# Patient Record
Sex: Female | Born: 1965 | Race: White | Hispanic: No | Marital: Married | Smoking: Never smoker
Health system: Southern US, Community
[De-identification: ages and names within clinical notes are randomized; demographics above are authoritative.]

## PROBLEM LIST (undated history)

## (undated) DIAGNOSIS — J309 Allergic rhinitis, unspecified: Secondary | ICD-10-CM

## (undated) DIAGNOSIS — G4730 Sleep apnea, unspecified: Secondary | ICD-10-CM

## (undated) HISTORY — DX: Sleep apnea, unspecified: G47.30

## (undated) HISTORY — DX: Allergic rhinitis, unspecified: J30.9

## (undated) HISTORY — PX: NO PAST SURGERIES: SHX2092

---

## 2008-03-12 ENCOUNTER — Ambulatory Visit: Payer: Self-pay | Admitting: Diagnostic Radiology

## 2008-03-12 ENCOUNTER — Emergency Department (HOSPITAL_BASED_OUTPATIENT_CLINIC_OR_DEPARTMENT_OTHER): Admission: EM | Admit: 2008-03-12 | Discharge: 2008-03-13 | Payer: Self-pay | Admitting: *Deleted

## 2009-08-20 IMAGING — CR DG KNEE COMPLETE 4+V*R*
5 series · 5 of 5 positions shown · non-contrast
Comparison: None

CLINICAL DATA: Fall.  Knee pain.

RIGHT KNEE - COMPLETE 4+ VIEW

[t knee ap right]
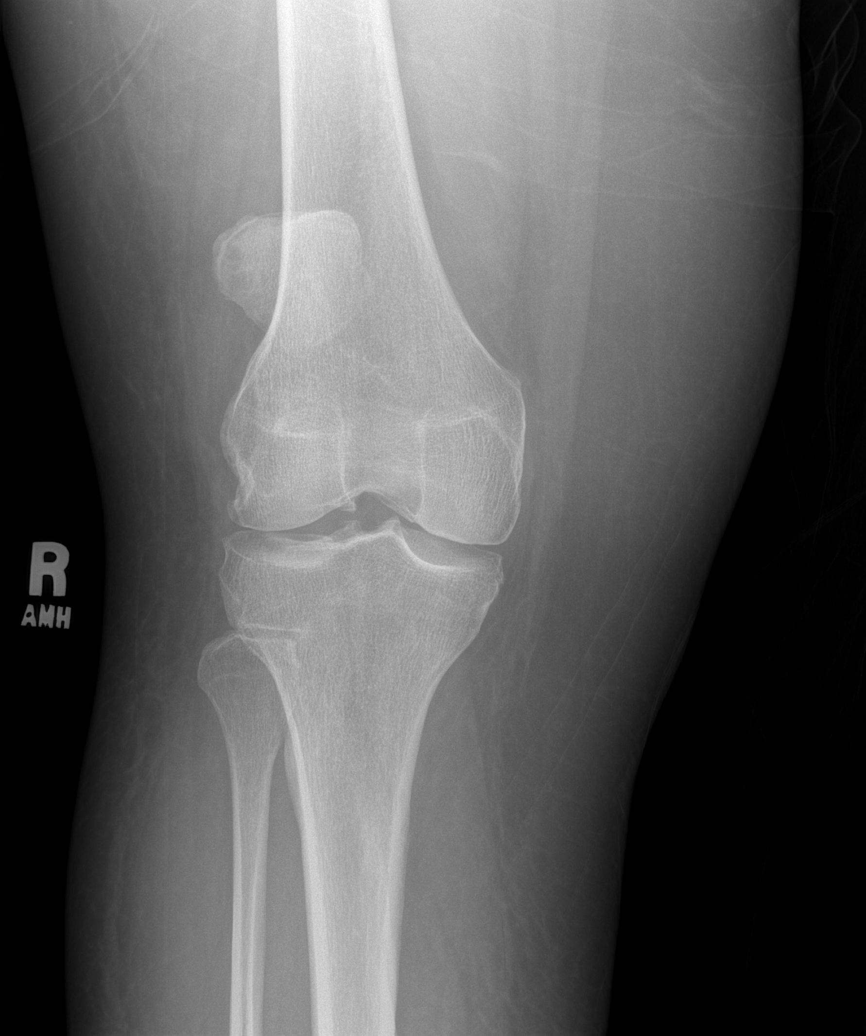

[t knee oblique right (1 of 3)]
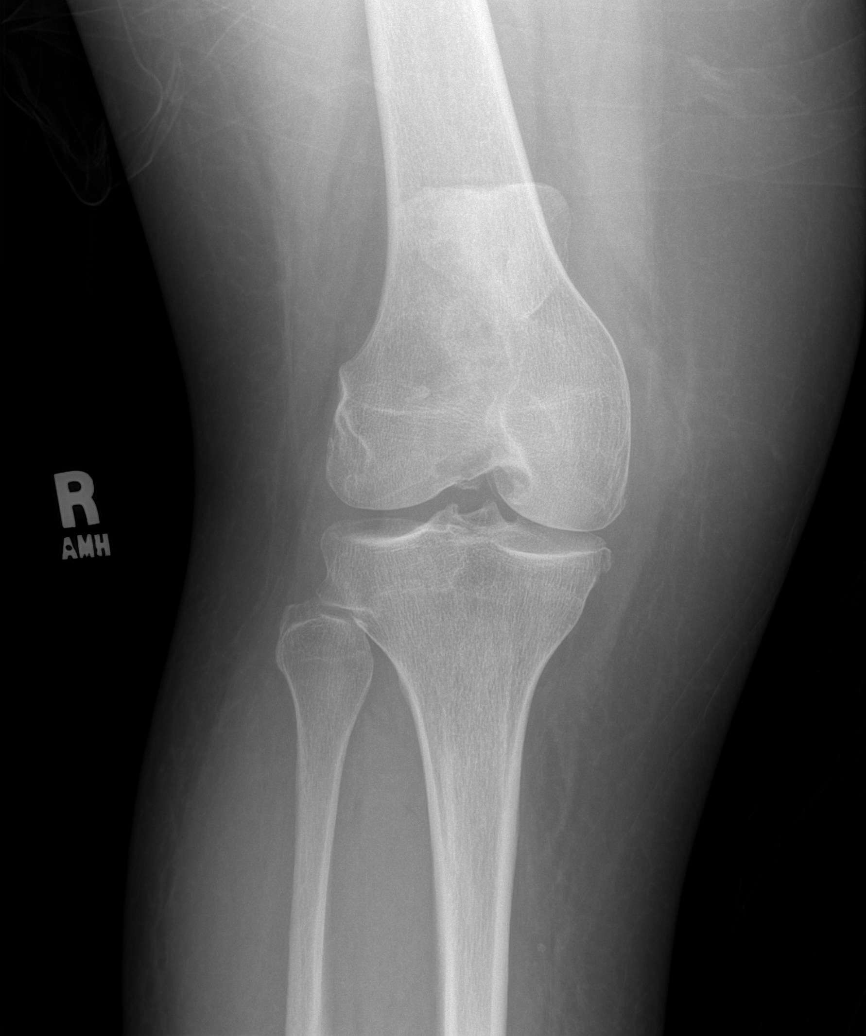

[t knee oblique right (2 of 3)]
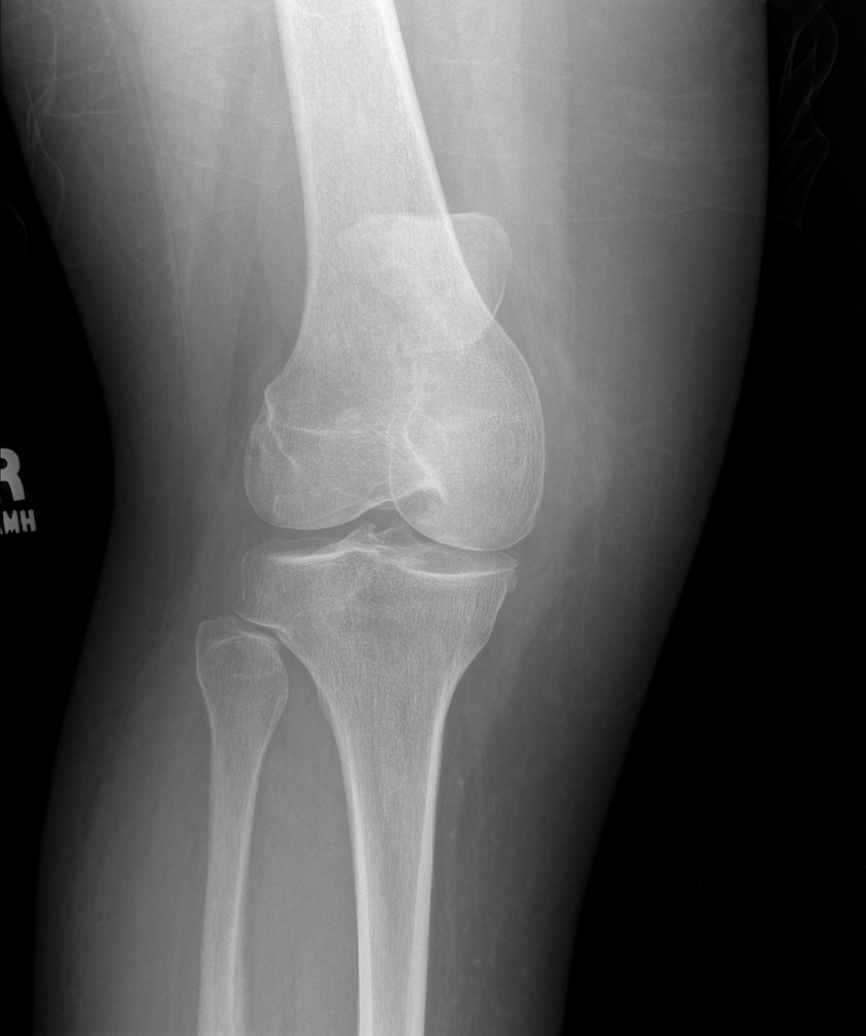

[t knee oblique right (3 of 3)]
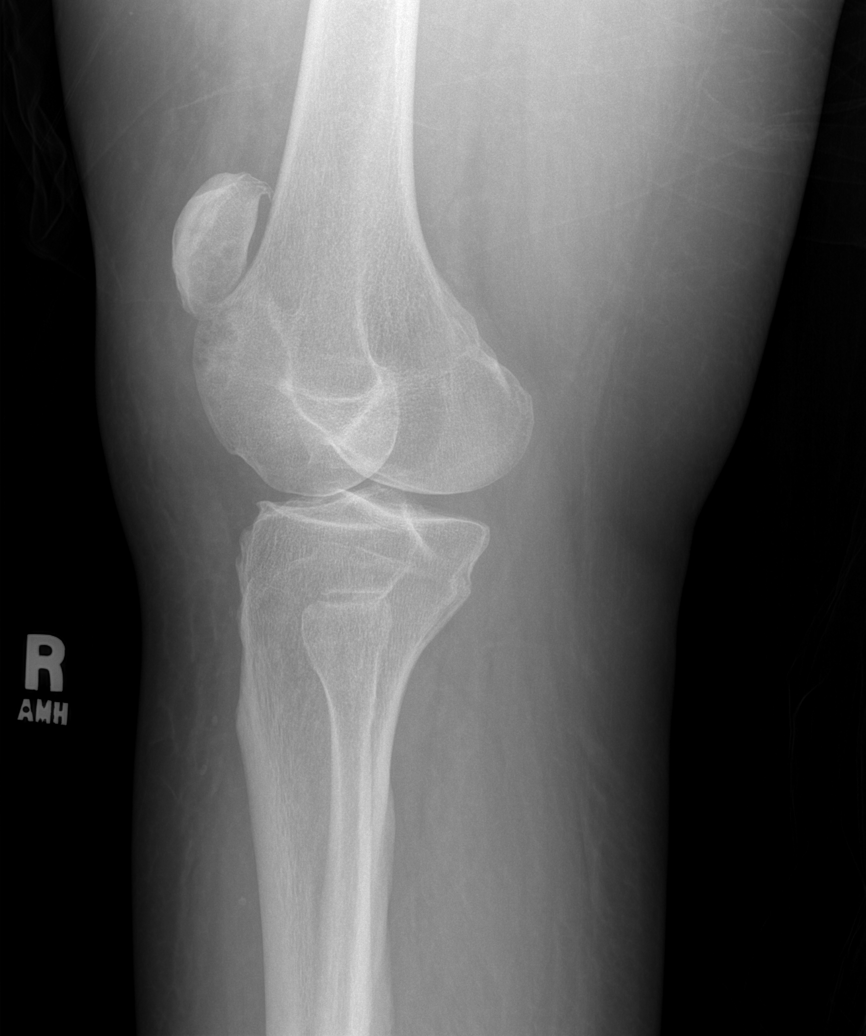

[t knee lat right]
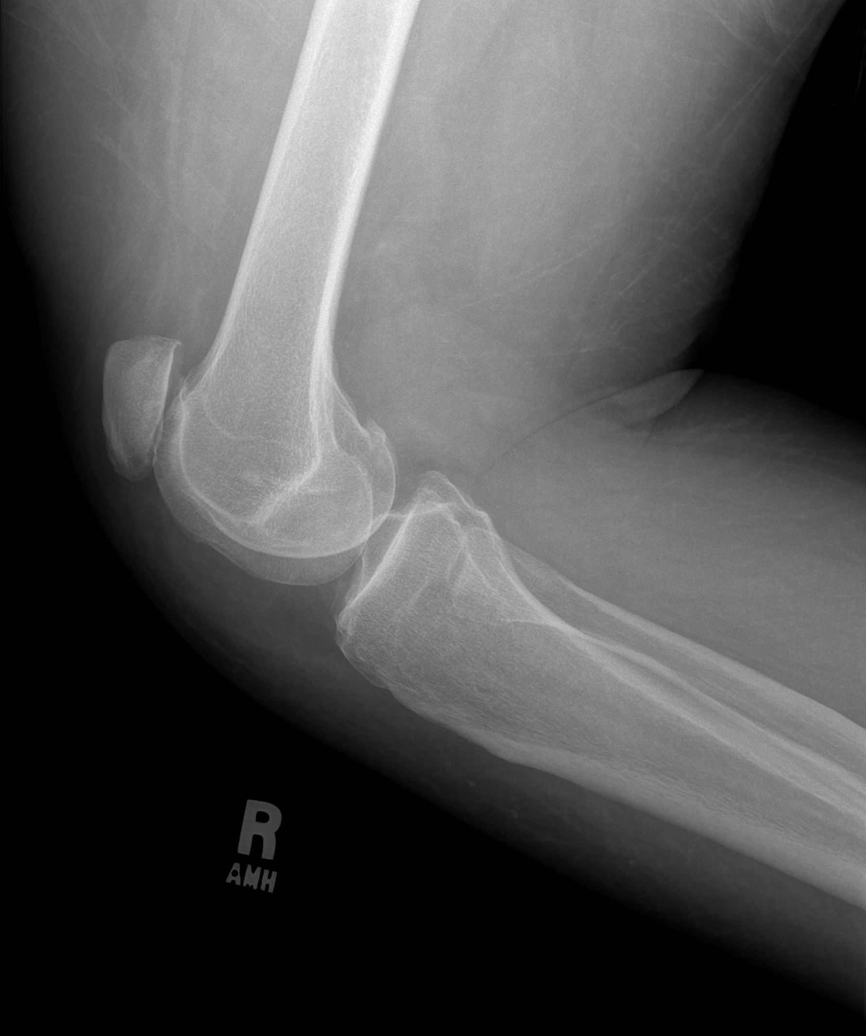

[5 of 5 positions shown; findings below may reference images not displayed]

FINDINGS: There is no joint effusion.

No fracture or dislocation identified.

Tricompartmental osteoarthritis with spur formation and sharpening
of the tibial spines noted.
IMPRESSION: 1.  No acute findings.
2.  Osteoarthritis

## 2015-02-23 ENCOUNTER — Encounter: Payer: Self-pay | Admitting: Emergency Medicine

## 2015-02-23 ENCOUNTER — Ambulatory Visit (INDEPENDENT_AMBULATORY_CARE_PROVIDER_SITE_OTHER): Payer: BLUE CROSS/BLUE SHIELD | Admitting: Emergency Medicine

## 2015-02-23 VITALS — BP 118/70 | HR 73 | Ht 62.0 in | Wt 340.0 lb

## 2015-02-23 DIAGNOSIS — G4733 Obstructive sleep apnea (adult) (pediatric): Secondary | ICD-10-CM

## 2015-02-23 NOTE — Assessment & Plan Note (Signed)
Based on description she has OSA. Quite severe. She needs PSG asap and then initiation of therapy. I described CPAP for her, she is willing to try it once diagnosed. We will f/u after the split nite study.

## 2015-02-23 NOTE — Progress Notes (Signed)
Subjective:    Patient ID: Brittany Valentine, female    DOB: 01/27/1966, 49 y.o.   MRN: [id number]  HPI 49 year-old woman, never smoker, with a history of obesity and no other significant medical problems. She is referred today for sleep eval. She snores loudly, has interrupted sleep. She is tired all the time. Does not every his a restful sleep. She can fall asleep at her desk, unintentionally. Has never fallen asleep driving. This has been a problem for 4-5 years, has worsened. She has had witnessed apneas. She has gained 70 lbs in the last 2 years.   Epworth score today > 20  She goes to sleep at 11 pm. She falls asleep after a few minutes. She wakes up 7 times to use the bathroom. She gets out of bed at 7am. She feels very tired in the morning. She denies morning headache. She does not use anything to help her fall sleep or stay awake.  Review of Systems  Constitutional: Negative for fever and unexpected weight change.  HENT: Negative for congestion, dental problem, ear pain, nosebleeds, postnasal drip, rhinorrhea, sinus pressure, sneezing, sore throat and trouble swallowing.   Eyes: Negative for redness and itching.  Respiratory: Negative for cough, chest tightness, shortness of breath and wheezing.   Cardiovascular: Negative for palpitations and leg swelling.  Gastrointestinal: Negative for nausea and vomiting.  Genitourinary: Negative for dysuria.  Musculoskeletal: Negative for joint swelling.  Skin: Negative for rash.  Neurological: Negative for headaches.  Hematological: Does not bruise/bleed easily.  Psychiatric/Behavioral: Positive for sleep disturbance. Negative for dysphoric mood. The patient is not nervous/anxious.    Past Medical History  Diagnosis Date  . Allergic rhinitis   . Sleep apnea      Family History  Problem Relation Age of Onset  . COPD Mother   . Diabetes Father   . Diabetes Maternal Grandfather      Social History   Social History  .  Marital Status: Married    Spouse Name: N/A  . Number of Children: N/A  . Years of Education: N/A   Occupational History  . Not on file.   Social History Main Topics  . Smoking status: Never Smoker   . Smokeless tobacco: Not on file  . Alcohol Use: Not on file  . Drug Use: Not on file  . Sexual Activity: Not on file   Other Topics Concern  . Not on file   Social History Narrative  . No narrative on file     Allergies  Allergen Reactions  . Sulfa Antibiotics   . Sulfur      No outpatient prescriptions prior to visit.   No facility-administered medications prior to visit.         Objective:   Physical Exam Filed Vitals:   02/23/15 1537 02/23/15 1545  BP:  118/70  Pulse:  73  Height: 5\' 2"  (1.575 m)   Weight: 340 lb (154.223 kg)   SpO2:  96%   Gen: Pleasant, obese woman, in no distress,  normal affect  ENT: No lesions,  mouth clear,  oropharynx clear, no postnasal drip, Mallampati 3-4   Neck: No JVD, large, no stridor  Lungs: No use of accessory muscles, clear without rales or rhonchi  Cardiovascular: RRR, heart sounds normal, no murmur or gallops, no peripheral edema  Musculoskeletal: No deformities, no cyanosis or clubbing  Neuro: alert, non focal  Skin: Warm, no lesions or rashes     Assessment & Plan:  OSA (obstructive sleep apnea) Based on description she has OSA. Quite severe. She needs PSG asap and then initiation of therapy. I described CPAP for her, she is willing to try it once diagnosed. We will f/u after the split nite study.    y

## 2015-02-23 NOTE — Patient Instructions (Signed)
We will perform a split-night sleep study to assess your breathing while sleeping.  We will likely need to order a CPAP machine for you to use at night.  Follow with Dr Delton CoombesByrum in 2 months or sooner if you have any problems.

## 2015-03-01 ENCOUNTER — Telehealth: Payer: Self-pay | Admitting: Emergency Medicine

## 2015-03-01 NOTE — Telephone Encounter (Signed)
Spoke with Kathlene NovemberMike at [facility]. Advised him that if the pt is more comfortable sleeping in a recliner, this is fine. Nothing further was needed.

## 2015-03-06 ENCOUNTER — Telehealth: Payer: Self-pay | Admitting: Emergency Medicine

## 2015-03-06 DIAGNOSIS — G4733 Obstructive sleep apnea (adult) (pediatric): Secondary | ICD-10-CM

## 2015-03-06 NOTE — Telephone Encounter (Signed)
Do not see a copy of the sleep study results in patient's chart.   I attempted to contact patient, but could not leave message, will try to call back.

## 2015-03-07 NOTE — Telephone Encounter (Signed)
Sleep study is in RB's look at. Will have him address this today when he's here. Pt is aware of this.

## 2015-03-08 NOTE — Telephone Encounter (Signed)
Pt is aware of results. Order for BiPAP has been made. Nothing further was needed.

## 2015-03-08 NOTE — Telephone Encounter (Signed)
Pt returning call and can be reached @ [phone].Caren GriffinsStanley A Dalton

## 2015-03-08 NOTE — Telephone Encounter (Signed)
Per RB >> Please order BiPAP 18/14cm H2O with heated humidity with a Resmed Airfit P10 mask, size small. lmtcb x1 for pt.

## 2015-03-14 ENCOUNTER — Telehealth: Payer: Self-pay | Admitting: Emergency Medicine

## 2015-03-14 NOTE — Telephone Encounter (Signed)
[facility] She confirmed that order and sleep study has been received.  Called and spoke with pt. I informed her that the order was ordered and sleep study was received. I explained to her that once [facility] the [facility] would be processed. She voiced understanding and had no further questions. Nothing further needed.

## 2015-03-15 ENCOUNTER — Telehealth: Payer: Self-pay | Admitting: Emergency Medicine

## 2015-03-15 NOTE — Telephone Encounter (Signed)
Boneta LucksJenny called from [facility] stating that [facility] needs signed sleep study interpretation and that Lawson Fis[facility]Lori with [facility] is contacting the sleep study center to obtain the sleep study. Boneta LucksJenny stated that [facility] did not need anymore information from our office and that Lawson FiscalLori would handle the sleep study. Nothing further needed.

## 2015-03-15 NOTE — Telephone Encounter (Signed)
Spoke with pt, states that [facility] to see what the holdup is on her cpap machine.  Per [facility], several bits of information is needed from our office in order to get her order processed, and was also told she needs a bipap instead of a cpap. Called [facility], states the need the sleep study-interpreted and raw data, titration study showing she has tried and failed cpap.   This info needs to be faxed to [phone] attn: Boneta LucksJenny   This has been faxed.  Nothing further needed at this time.

## 2015-03-15 NOTE — Telephone Encounter (Signed)
Spoke with pt, states she [facility] to [facility], states they only have half of the sleep study-need the rest of the sleep study and interpretation report.   Called [facility], asked to speak to a respiratory therapist to help see exactly what is needed to cut out this back and forth-both RT's were unavailable.  Lmtcb.  Will await call.

## 2015-03-16 ENCOUNTER — Telehealth: Payer: Self-pay | Admitting: Emergency Medicine

## 2015-03-16 NOTE — Telephone Encounter (Signed)
Called and spoke with pt. Pt states that [facility] and that they were waiting on a signed sleep study. I explained to her that I spoke with [facility]15 and she stated that they had all the forms and orders they needed from us. The only thing that was holding up the process was the signed interpretation and that Lawson FiscalLori with [facility] was working on getting the copy from the sleep center. Pt voiced understanding and had no further questions. Nothing further needed.

## 2015-03-16 NOTE — Telephone Encounter (Signed)
See other phone note from 12/28 

## 2015-03-22 ENCOUNTER — Encounter: Payer: Self-pay | Admitting: Emergency Medicine

## 2015-03-28 ENCOUNTER — Telehealth: Payer: Self-pay | Admitting: Emergency Medicine

## 2015-03-28 NOTE — Telephone Encounter (Signed)
Spoke with pt, states that she will have to pay out of pocket for bipap machine.  Pt requesting that we contact [facility] for the model# of the bipap she needs so she can try to find the best deal for her machine.   [facility], states that pt was to be set up with Bipap S-no model number.   Spoke with pt, aware of this.  Nothing further needed.

## 2015-04-12 ENCOUNTER — Telehealth: Payer: Self-pay | Admitting: Emergency Medicine

## 2015-04-12 DIAGNOSIS — G4733 Obstructive sleep apnea (adult) (pediatric): Secondary | ICD-10-CM

## 2015-04-12 NOTE — Telephone Encounter (Signed)
Per Dot Lanes, patient advised her that she does not want another nurse to call her back, she only wants to speak to Dr. Delton Coombes, not anyone else. She is requesting Dr. Delton Coombes call her at [phone] or work at [phone]  Sent message to [facility]

## 2015-04-14 NOTE — Telephone Encounter (Signed)
Order for BiPAP sent to [facility] per Dr. Kavin Leech request. Nothing further needed.

## 2015-04-14 NOTE — Telephone Encounter (Signed)
Pt concerned regarding the order to [facility] regarding her BiPAP order. She has communicated with them and with her insurance company. Is still having problems getting the device, has considered buying one herself which seems absurd.   I believe she needs a referral to another DME company. Please send a referral for her biPAP to [facility].

## 2015-04-14 NOTE — Telephone Encounter (Signed)
[phone] or [phone] pt calling back

## 2015-04-14 NOTE — Telephone Encounter (Signed)
Patient calling regarding questions about BiPAP machine.  She said that she doesn't want to "drop $1500" without discussing the options with Dr. Delton Coombes first. Would not got into any further details, just said that she was expecting a call from Dr. Delton Coombes, not a nurse and she only wants to speak to Dr. Delton Coombes, did not want to discuss with a nurse.  Patient is requesting that Dr. Delton Coombes call her today before 5:30pm at [phone] or work phone: [phone]

## 2015-04-19 ENCOUNTER — Telehealth: Payer: Self-pay | Admitting: Emergency Medicine

## 2015-04-19 NOTE — Telephone Encounter (Signed)
Spoke with Larita Fife at [facility], she received the order for BiPAP, however, she has no records indicating that patient has tried and failed CPAP.  Before patient's insurance will pay for a BiPAP, she must first try and fail the CPAP.  Did not see in chart that patient had tried CPAP.    Dr. Delton Coombes, please advise

## 2015-04-19 NOTE — Telephone Encounter (Signed)
The patient failed CPAP during the split-night sleep study. She was attempted on CPAP but as this was inadequate to manage her apneas she was then switched to BiPAP which was titrated as ordered. Thus we have a documented failure of CPAP to manage her obstructive events. Forward this and explain to the insurance company if necessary

## 2015-04-20 NOTE — Telephone Encounter (Signed)
We do not have sleep study scanned yet. Pt had a cpap titration. I called Larita Fife at [facility] & told her I would get sleep center to fax split night study to her. Called sleep center & spoke to Terri.  She is going to fax split night study to [facility]. Nothing further should be needed.

## 2015-04-21 ENCOUNTER — Telehealth: Payer: Self-pay | Admitting: Emergency Medicine

## 2015-04-21 DIAGNOSIS — G4733 Obstructive sleep apnea (adult) (pediatric): Secondary | ICD-10-CM

## 2015-04-21 NOTE — Telephone Encounter (Signed)
Lm with Brittany Valentine at [facility] for Brittany Valentine to cb

## 2015-04-21 NOTE — Telephone Encounter (Signed)
OK to cancel the BiPAP order and change to CPAP 15cm H2O to see if she tolerates.

## 2015-04-21 NOTE — Telephone Encounter (Signed)
RB please advise if this sleep study can be addended, or if not how you would like to proceed.  Thanks.

## 2015-04-21 NOTE — Telephone Encounter (Signed)
Called spoke with Larita Fife from [facility]. She reviewed the sleep study and in the report it does not specifically state "pt failed CPAP". She reports it must state this. PCC's can the order be sent to another DME?

## 2015-04-21 NOTE — Telephone Encounter (Signed)
[facility] Brittany Valentine [phone]

## 2015-04-21 NOTE — Telephone Encounter (Signed)
Pt has to have tried and failed cpap   To be on bipap unless it is for chronic resp failure Brittany Valentine

## 2015-04-24 ENCOUNTER — Telehealth: Payer: Self-pay | Admitting: Emergency Medicine

## 2015-04-24 NOTE — Telephone Encounter (Signed)
Spoke with pt. States that she was called by [facility]. She was notified that they will be supplying her with a CPAP. Pt is questioning this due to being told that she would start BiPAP. Advised her that her insurance would not cover the BiPAP with a certain diagnosis code. All of her questions were answered. Nothing further was needed.

## 2015-04-24 NOTE — Telephone Encounter (Signed)
Order has been changed per RB. [facility] is aware of this. Nothing further was needed.

## 2015-04-28 ENCOUNTER — Ambulatory Visit (INDEPENDENT_AMBULATORY_CARE_PROVIDER_SITE_OTHER): Payer: BLUE CROSS/BLUE SHIELD | Admitting: Emergency Medicine

## 2015-07-21 ENCOUNTER — Ambulatory Visit: Payer: BLUE CROSS/BLUE SHIELD | Admitting: Emergency Medicine

## 2015-08-02 ENCOUNTER — Telehealth: Payer: Self-pay | Admitting: Emergency Medicine

## 2015-08-02 NOTE — Telephone Encounter (Signed)
[facility] pt, line rang several times, no answer, no vm.  Wcb.  

## 2015-08-02 NOTE — Telephone Encounter (Signed)
Have her try loratadine 10mg  daily and also chlorpheniramine 4mg  q6h prn for congestion.  She might also want to try nasal saline washes qd

## 2015-08-02 NOTE — Telephone Encounter (Signed)
Spoke with pt, states flonase is not working for her- c/o worsening sinus pressure, difficulty breathing through nose on both nares qhs.  Pt wants to know if there's something else OTC or behind the counter that RB can recommend.   [facility] [place] [facility] [address] [place].  RB please advise on recs.  Thanks!

## 2015-08-03 NOTE — Telephone Encounter (Signed)
Called spoke with pt. Reviewed RB's recs. Pt voiced understanding and had no further questions. Nothing further needed.  

## 2015-08-10 ENCOUNTER — Telehealth: Payer: Self-pay | Admitting: Emergency Medicine

## 2015-08-10 NOTE — Telephone Encounter (Signed)
Spoke with pt, states that she received a cpap machine but did not know what pressure to set it to.  I advised pt that per last referral to [facility] her pressure was to be set to 15cm H2O.  Pt expressed understanding.  Nothing further needed.

## 2015-08-21 ENCOUNTER — Telehealth: Payer: Self-pay | Admitting: Emergency Medicine

## 2015-08-21 DIAGNOSIS — G4733 Obstructive sleep apnea (adult) (pediatric): Secondary | ICD-10-CM

## 2015-08-21 NOTE — Telephone Encounter (Signed)
Attempted to contact pt. No answer, no option to leave a message. Will try back.  

## 2015-08-22 NOTE — Telephone Encounter (Signed)
[facility] pt, NA and no option to leave a message, wcb

## 2015-08-23 NOTE — Telephone Encounter (Signed)
Spoke with the pt She states that she never received CPAP machine  Her family chipped in and bought her a BIPAP machine  She is now needing us to send the list of supplies needed and what the settings are  She wants a mask with nasal pillows  Please advise, thanks

## 2015-08-23 NOTE — Telephone Encounter (Signed)
Spoke with pt. She is aware of the below information. Order has been placed per RB. Nothing further was needed.

## 2015-08-23 NOTE — Telephone Encounter (Signed)
Unclear to me why CPAP was never obtained - it was ordered as best I can tell. Regardless, based on the BiPAP titration study that we have on file she should be set on iPAP 18 / ePAP 14 on the existing device. She has been working with [facility] and can keep them vs get a new DME if she prefers.   She will need order for:    - BiPAP pressure set-up of 18/14 - a nasal pillows mask, size to be determined by best fit on their evaluation.  - tubing, filters, and supplies to maintain her BiPAP that she purchased.

## 2015-08-24 ENCOUNTER — Telehealth: Payer: Self-pay | Admitting: Emergency Medicine

## 2015-08-24 NOTE — Telephone Encounter (Signed)
I called and was on hold x 5 min  I see that we already send order though, so [facility]  Please help, thanks

## 2015-08-25 NOTE — Telephone Encounter (Signed)
Spoke to [email or web address]  she has the order and nothing else is needed Tobe SosSally E Ottinger

## 2015-10-06 ENCOUNTER — Ambulatory Visit: Payer: BLUE CROSS/BLUE SHIELD | Admitting: Emergency Medicine

## 2015-12-29 ENCOUNTER — Encounter: Payer: Self-pay | Admitting: Emergency Medicine

## 2015-12-29 ENCOUNTER — Ambulatory Visit (INDEPENDENT_AMBULATORY_CARE_PROVIDER_SITE_OTHER): Payer: BLUE CROSS/BLUE SHIELD | Admitting: Emergency Medicine

## 2015-12-29 DIAGNOSIS — G4733 Obstructive sleep apnea (adult) (pediatric): Secondary | ICD-10-CM | POA: Diagnosis not present

## 2015-12-29 NOTE — Patient Instructions (Signed)
Please continue your biPAP every night on your current settings.  Follow with Dr Delton CoombesByrum in 12 months or sooner if you have any problems

## 2015-12-29 NOTE — Progress Notes (Signed)
   Subjective:    Patient ID: Brittany Valentine, female    DOB: 03/09/1966, 50 y.o.   MRN: [id number]  HPI 50 year-old woman, never smoker, with a history of obesity and no other significant medical problems. She is referred today for sleep eval. She snores loudly, has interrupted sleep. She is tired all the time. Does not every his a restful sleep. She can fall asleep at her desk, unintentionally. Has never fallen asleep driving. This has been a problem for 4-5 years, has worsened. She has had witnessed apneas. She has gained 70 lbs in the last 2 years.   Epworth score today > 20  She goes to sleep at 11 pm. She falls asleep after a few minutes. She wakes up 7 times to use the bathroom. She gets out of bed at 7am. She feels very tired in the morning. She denies morning headache. She does not use anything to help her fall sleep or stay awake.  ROV 12/29/15 -- follow up visit for OSA / OHS and BiPAP use. She was finally able to get her BiPAP in June. Ordered 18/14 with nasal pillows. She is tolerating the mask well. She no longer falls asleep unintentionally during the day. She no longer feels so tired during the day. In the am she is restored. She has lost 16 lbs since 12/'16.    Review of Systems  Constitutional: Negative for fever and unexpected weight change.  HENT: Negative for congestion, dental problem, ear pain, nosebleeds, postnasal drip, rhinorrhea, sinus pressure, sneezing, sore throat and trouble swallowing.   Eyes: Negative for redness and itching.  Respiratory: Negative for cough, chest tightness, shortness of breath and wheezing.   Cardiovascular: Negative for palpitations and leg swelling.  Gastrointestinal: Negative for nausea and vomiting.  Genitourinary: Negative for dysuria.  Musculoskeletal: Negative for joint swelling.  Skin: Negative for rash.  Neurological: Negative for headaches.  Hematological: Does not bruise/bleed easily.  Psychiatric/Behavioral: Positive  for sleep disturbance. Negative for dysphoric mood. The patient is not nervous/anxious.         Objective:   Physical Exam Vitals:   12/29/15 1556  BP: 126/82  Pulse: 78  SpO2: 98%  Weight: (!) 324 lb 12.8 oz (147.3 kg)  Height: 5\' 2"  (1.575 m)   Gen: Pleasant, obese woman, in no distress,  normal affect  ENT: No lesions,  mouth clear,  oropharynx clear, no postnasal drip, Mallampati 3-4   Neck: No JVD, large, no stridor  Lungs: No use of accessory muscles, clear without rales or rhonchi  Cardiovascular: RRR, heart sounds normal, no murmur or gallops, no peripheral edema  Musculoskeletal: No deformities, no cyanosis or clubbing  Neuro: alert, non focal  Skin: Warm, no lesions or rashes     Assessment & Plan:  OSA (obstructive sleep apnea) She has good compliance with her BiPAP, uses nasal pillows. Notes a significant improvement in how she feels since starting it. Epworth is down. Will contrinue her current BiPAP settings.   Levy Pupaobert Byrum, MD, PhD 12/29/2015, 4:19 PM [facility] [phone] or if no answer [phone]

## 2015-12-29 NOTE — Assessment & Plan Note (Signed)
She has good compliance with her BiPAP, uses nasal pillows. Notes a significant improvement in how she feels since starting it. Epworth is down. Will contrinue her current BiPAP settings.

## 2017-02-14 ENCOUNTER — Encounter: Payer: Self-pay | Admitting: Emergency Medicine

## 2017-02-14 ENCOUNTER — Ambulatory Visit: Payer: BLUE CROSS/BLUE SHIELD | Admitting: Emergency Medicine

## 2017-02-14 DIAGNOSIS — G4733 Obstructive sleep apnea (adult) (pediatric): Secondary | ICD-10-CM | POA: Diagnosis not present

## 2017-02-14 NOTE — Assessment & Plan Note (Signed)
Great compliance with your BiPAP machine.  Please continue to use it reliably as you have been doing. Follow with Dr. Delton CoombesByrum in October 2019 or sooner if you have any problems.

## 2017-02-14 NOTE — Progress Notes (Signed)
   Subjective:    Patient ID: Brittany Valentine, female    DOB: 01/07/1966, 51 y.o.   MRN: [id number]  HPI  ROV 12/29/15 -- follow up visit for OSA / OHS and BiPAP use. She was finally able to get her BiPAP in June. Ordered 18/14 with nasal pillows. She is tolerating the mask well. She no longer falls asleep unintentionally during the day. She no longer feels so tired during the day. In the am she is restored. She has lost 16 lbs since 12/'16.   ROV 02/14/17 --51 year old woman who follows up today for her obstructive sleep apnea and obesity hypoventilation syndrome.  She has been maintained on BiPAP with settings at 18/14 utilizing nasal pillows. She has great compliance, uses for > 4 hours every night. She is clinically benefiting. les daytime sleepiness or dyspnea. She goes to medical supply when she needs replacement equipment as she owns her device.    Review of Systems  Constitutional: Negative for fever and unexpected weight change.  HENT: Negative for congestion, dental problem, ear pain, nosebleeds, postnasal drip, rhinorrhea, sinus pressure, sneezing, sore throat and trouble swallowing.   Eyes: Negative for redness and itching.  Respiratory: Negative for cough, chest tightness, shortness of breath and wheezing.   Cardiovascular: Negative for palpitations and leg swelling.  Gastrointestinal: Negative for nausea and vomiting.  Genitourinary: Negative for dysuria.  Musculoskeletal: Negative for joint swelling.  Skin: Negative for rash.  Neurological: Negative for headaches.  Hematological: Does not bruise/bleed easily.  Psychiatric/Behavioral: Positive for sleep disturbance. Negative for dysphoric mood. The patient is not nervous/anxious.        Objective:   Physical Exam Vitals:   02/14/17 1543 02/14/17 1544  BP:  138/70  Pulse:  77  SpO2:  96%  Weight: (!) 309 lb (140.2 kg)   Height: 5\' 2"  (1.575 m)    Gen: Pleasant, obese woman, in no distress,  normal affect  ENT:  No lesions,  mouth clear,  oropharynx clear, no postnasal drip, Mallampati 3-4   Neck: No JVD, large, no stridor  Lungs: No use of accessory muscles, clear without rales or rhonchi  Cardiovascular: RRR, heart sounds normal, no murmur or gallops, no peripheral edema  Musculoskeletal: No deformities, no cyanosis or clubbing  Neuro: alert, non focal  Skin: Warm, no lesions or rashes     Assessment & Plan:  OSA (obstructive sleep apnea) Great compliance with your BiPAP machine.  Please continue to use it reliably as you have been doing. Follow with Dr. Delton CoombesByrum in October 2019 or sooner if you have any problems.  Levy Pupaobert Fynley Chrystal, MD, PhD 02/14/2017, 4:10 PM [facility] [phone] or if no answer [phone]

## 2017-02-14 NOTE — Patient Instructions (Signed)
Great compliance with your BiPAP machine.  Please continue to use it reliably as you have been doing. Follow with Dr. Byrum in October 2019 or sooner if you have any problems. 

## 2019-10-19 ENCOUNTER — Other Ambulatory Visit: Payer: Self-pay

## 2019-10-19 ENCOUNTER — Emergency Department (INDEPENDENT_AMBULATORY_CARE_PROVIDER_SITE_OTHER)
Admission: EM | Admit: 2019-10-19 | Discharge: 2019-10-19 | Disposition: A | Discharge status: Home / Self Care | Payer: BC Managed Care – PPO | Source: Home / Self Care | Attending: Family Medicine | Admitting: Family Medicine

## 2019-10-19 DIAGNOSIS — E1165 Type 2 diabetes mellitus with hyperglycemia: Secondary | ICD-10-CM

## 2019-10-19 LAB — POCT FASTING CBG KUC MANUAL ENTRY: POCT Glucose (KUC): 277 mg/dL — AB (ref 70–99)

## 2019-10-19 LAB — POCT URINALYSIS DIP (MANUAL ENTRY)
Bilirubin, UA: NEGATIVE
Glucose, UA: 100 mg/dL — AB
Ketones, POC UA: NEGATIVE mg/dL
Leukocytes, UA: NEGATIVE
Nitrite, UA: NEGATIVE
Protein Ur, POC: NEGATIVE mg/dL
Spec Grav, UA: 1.025 (ref 1.010–1.025)
Urobilinogen, UA: 0.2 E.U./dL
pH, UA: 5 (ref 5.0–8.0)

## 2019-10-19 MED ORDER — METFORMIN HCL 500 MG PO TABS: 500.0000 mg | ORAL_TABLET | Freq: Two times a day (BID) | ORAL | 1 refills | Status: AC

## 2019-10-19 NOTE — ED Triage Notes (Signed)
Patient presents to Urgent Care with complaints of possible high blood sugar since two weeks ago. Patient reports she has not been able to keep it under 350, highest glucose levels are highest in the mornings when she wakes up (usually around 0630).  She reports blurry vision and watery eyes, has been controlling her blood sugar by diet. Pt states she has lost 35 lbs in the past 6 weeks.

## 2019-10-19 NOTE — ED Provider Notes (Signed)
[facility]    CSN: [id number] Arrival date & time: 10/19/19  1837      History   Chief Complaint Chief Complaint  Patient presents with  . Hyperglycemia    HPI Brittany Valentine is a 54 y.o. female.   Patient presents for evaluation of elevated glucose.  She states that she has been losing weight for about 3 weeks (35 pounds), and her glucose has remained high during this period.  Her glucose is often over 350, especially in the mornings, and is not responding to diet.  She has not been treated for diabetes in the past.  Although she has arranged an initial PCP appointment with Dr. Jordan Likes in September, she presents today to begin diabetes management. Her family history is significant for diabetes in her father, paternal grandfather, and a maternal cousin.  Her father had an MI.  The history is provided by the patient.    Past Medical History:  Diagnosis Date  . Allergic rhinitis   . Sleep apnea     Patient Active Problem List   Diagnosis Date Noted  . OSA (obstructive sleep apnea) 02/23/2015    Past Surgical History:  Procedure Laterality Date  . NO PAST SURGERIES      OB History   No obstetric history on file.      Home Medications    Prior to Admission medications   Medication Sig Start Date End Date Taking? Authorizing Provider  aspirin 325 MG tablet Take 325 mg by mouth daily.    [provider]  metFORMIN (GLUCOPHAGE) 500 MG tablet Take 1 tablet (500 mg total) by mouth 2 (two) times daily with a meal. 10/19/19 10/18/20  Lattie Haw, MD    Family History Family History  Problem Relation Age of Onset  . COPD Mother   . Diabetes Father   . Diabetes Maternal Grandfather     Social History Social History   Tobacco Use  . Smoking status: Never Smoker  . Smokeless tobacco: Never Used  Substance Use Topics  . Alcohol use: Not Currently    Alcohol/week: 0.0 standard drinks    Comment: rare  . Drug use: Not on file      Allergies   Sulfa antibiotics and Sulfur   Review of Systems Review of Systems  Constitutional: Positive for activity change, fatigue and unexpected weight change. Negative for appetite change, chills, diaphoresis and fever.  HENT: Negative.   Eyes: Positive for visual disturbance.  Respiratory: Negative.   Cardiovascular: Negative.   Gastrointestinal: Negative.   Endocrine: Negative for polyuria.  Genitourinary: Negative.   Musculoskeletal: Negative.   Skin: Negative.   Neurological: Negative.      Physical Exam Triage Vital Signs ED Triage Vitals  Enc Vitals Group     BP 10/19/19 1857 (!) 167/99     Pulse Rate 10/19/19 1857 65     Resp 10/19/19 1857 16     Temp 10/19/19 1857 98.7 F (37.1 C)     Temp Source 10/19/19 1857 Oral     SpO2 10/19/19 1857 99 %     Weight --      Height --      Head Circumference --      Peak Flow --      Pain Score 10/19/19 1854 0     Pain Loc --      Pain Edu? --      Excl. in GC? --    No data found.  Updated  Vital Signs BP (!) 167/99 (BP Location: Right Arm)   Pulse 65   Temp 98.7 F (37.1 C) (Oral)   Resp 16   SpO2 99%   Visual Acuity Right Eye Distance:   Left Eye Distance:   Bilateral Distance:    Right Eye Near:   Left Eye Near:    Bilateral Near:     Physical Exam Nursing notes and Vital Signs reviewed. Appearance:  Patient appears stated age, alert and oriented, and in no acute distress.  She is obese. Eyes:  Pupils are equal, round, and reactive to light and accomodation.  Extraocular movement is intact.  Conjunctivae are not inflamed  Ears:  Normal externally. Nose:  Normal turbinates.  No sinus tenderness.  Pharynx:  Normal Neck:  Supple. No adenopathy or thyromegaly. Lungs:  Clear to auscultation.  Breath sounds are equal.  Moving air well. Heart:  Regular rate and rhythm without murmurs, rubs, or gallops.  Abdomen:  Nontender without masses or hepatosplenomegaly.  Bowel sounds are present.  No CVA  or flank tenderness.  Extremities:  Trace edema.  Skin:  No rash present.   UC Treatments / Results  Labs (all labs ordered are listed, but only abnormal results are displayed) Labs Reviewed  POCT FASTING CBG [facility] MANUAL ENTRY - Abnormal; Notable for the following components:      Result Value   POCT Glucose ([facility]) 277 (*)    All other components within normal limits  POCT URINALYSIS DIP (MANUAL ENTRY) - Abnormal; Notable for the following components:   Glucose, UA =100 (*)    Blood, UA trace-intact (*)    All other components within normal limits  COMPLETE METABOLIC PANEL WITH GFR  CBC WITH DIFFERENTIAL/PLATELET  HEMOGLOBIN A1C    EKG   Radiology No results found.  Procedures Procedures (including critical care time)  Medications Ordered in UC Medications - No data to display  Initial Impression / Assessment and Plan / UC Course  I have reviewed the triage vital signs and the nursing notes.  Pertinent labs & imaging results that were available during my care of the patient were reviewed by me and considered in my medical decision making (see chart for details).    CMP, CBC, Hgb A1C, and urinalysis pending. Begin starting dose of metformin 500mg  BID Followup with PCP as scheduled diabetes management.  Note elevated BP today also. Followup with ophthalmologist.   Final Clinical Impressions(s) / UC Diagnoses   Final diagnoses:  Type 2 diabetes mellitus with hyperglycemia, without long-term current use of insulin (HCC)     Discharge Instructions     Monitor glucose daily and record on a calendar.    ED Prescriptions    Medication Sig Dispense Auth. Provider   metFORMIN (GLUCOPHAGE) 500 MG tablet Take 1 tablet (500 mg total) by mouth 2 (two) times daily with a meal. 60 tablet , MD        Lattie Haw, MD 10/23/19 [phone]

## 2019-10-19 NOTE — Discharge Instructions (Signed)
Monitor glucose daily and record on a calendar.

## 2019-10-20 LAB — COMPLETE METABOLIC PANEL WITH GFR
AG Ratio: 1.4 (calc) (ref 1.0–2.5)
ALT: 45 U/L — ABNORMAL HIGH (ref 6–29)
AST: 29 U/L (ref 10–35)
Albumin: 4.3 g/dL (ref 3.6–5.1)
Alkaline phosphatase (APISO): 77 U/L (ref 37–153)
BUN: 16 mg/dL (ref 7–25)
CO2: 25 mmol/L (ref 20–32)
Calcium: 9.9 mg/dL (ref 8.6–10.4)
Chloride: 99 mmol/L (ref 98–110)
Creat: 0.88 mg/dL (ref 0.50–1.05)
GFR, Est African American: 87 mL/min/{1.73_m2} (ref >=60)
GFR, Est Non African American: 75 mL/min/{1.73_m2} (ref >=60)
Globulin: 3 g/dL (calc) (ref 1.9–3.7)
Glucose, Bld: 275 mg/dL — ABNORMAL HIGH (ref 65–99)
Potassium: 4.3 mmol/L (ref 3.5–5.3)
Sodium: 136 mmol/L (ref 135–146)
Total Bilirubin: 0.5 mg/dL (ref 0.2–1.2)
Total Protein: 7.3 g/dL (ref 6.1–8.1)

## 2019-10-20 LAB — CBC WITH DIFFERENTIAL/PLATELET
Absolute Monocytes: 608 cells/uL (ref 200–950)
Basophils Absolute: 29 cells/uL (ref 0–200)
Basophils Relative: 0.3 %
Eosinophils Absolute: 247 cells/uL (ref 15–500)
Eosinophils Relative: 2.6 %
HCT: 45 % (ref 35.0–45.0)
Hemoglobin: 14.7 g/dL (ref 11.7–15.5)
Lymphs Abs: 3259 cells/uL (ref 850–3900)
MCH: 29.6 pg (ref 27.0–33.0)
MCHC: 32.7 g/dL (ref 32.0–36.0)
MCV: 90.5 fL (ref 80.0–100.0)
MPV: 12.9 fL — ABNORMAL HIGH (ref 7.5–12.5)
Monocytes Relative: 6.4 %
Neutro Abs: 5358 cells/uL (ref 1500–7800)
Neutrophils Relative %: 56.4 %
Platelets: 184 10*3/uL (ref 140–400)
RBC: 4.97 10*6/uL (ref 3.80–5.10)
RDW: 12.4 % (ref 11.0–15.0)
Total Lymphocyte: 34.3 %
WBC: 9.5 10*3/uL (ref 3.8–10.8)

## 2019-10-20 LAB — HEMOGLOBIN A1C: Hgb A1c MFr Bld: >14 % of total Hgb — ABNORMAL HIGH (ref <5.7)

## 2023-02-04 ENCOUNTER — Encounter: Payer: Self-pay | Admitting: Physician Assistant

## 2023-03-19 ENCOUNTER — Ambulatory Visit
Admission: EM | Admit: 2023-03-19 | Discharge: 2023-03-19 | Disposition: A | Discharge status: Home / Self Care | Payer: BC Managed Care – PPO

## 2023-03-19 ENCOUNTER — Encounter: Payer: Self-pay | Admitting: Emergency Medicine

## 2023-03-19 DIAGNOSIS — R059 Cough, unspecified: Secondary | ICD-10-CM | POA: Diagnosis not present

## 2023-03-19 DIAGNOSIS — J0100 Acute maxillary sinusitis, unspecified: Secondary | ICD-10-CM

## 2023-03-19 DIAGNOSIS — R0989 Other specified symptoms and signs involving the circulatory and respiratory systems: Secondary | ICD-10-CM

## 2023-03-19 MED ORDER — BENZONATATE 200 MG PO CAPS: 200.0000 mg | ORAL_CAPSULE | Freq: Three times a day (TID) | ORAL | 0 refills | Status: AC | PRN

## 2023-03-19 MED ORDER — AMOXICILLIN-POT CLAVULANATE 875-125 MG PO TABS: 1.0000 | ORAL_TABLET | Freq: Two times a day (BID) | ORAL | 0 refills | Status: DC

## 2023-03-19 MED ORDER — PREDNISONE 20 MG PO TABS: ORAL_TABLET | ORAL | 0 refills | Status: AC

## 2023-03-19 NOTE — ED Triage Notes (Signed)
 Patient c/o chest congestion, cough, nasal drainage, facial swelling and redness  x 4 days.  Patient unsure if she has a sinus infection.  Patient has taken Aleve Sinus D w/o any relief.

## 2023-03-19 NOTE — ED Provider Notes (Signed)
 Brittany Valentine    CSN: [id number] Arrival date & time: 03/19/23  1552      History   Chief Complaint Chief Complaint  Patient presents with   Chest Congestion    HPI Brittany Valentine is a 58 y.o. female.   HPI 58 year old female presents with congestion.  PMH significant for morbid obesity and OSA.  Past Medical History:  Diagnosis Date   Allergic rhinitis    Sleep apnea     Patient Active Problem List   Diagnosis Date Noted   OSA (obstructive sleep apnea) 02/23/2015    Past Surgical History:  Procedure Laterality Date   NO PAST SURGERIES      OB History   No obstetric history on file.      Home Medications    Prior to Admission medications   Medication Sig Start Date End Date Taking? Authorizing Provider  amoxicillin -clavulanate (AUGMENTIN ) 875-125 MG tablet Take 1 tablet by mouth every 12 (twelve) hours. 03/19/23  Yes Teddy Sharper, FNP  atorvastatin (LIPITOR) 10 MG tablet Take 10 mg by mouth daily. 04/04/22  Yes [provider]  benzonatate  (TESSALON ) 200 MG capsule Take 1 capsule (200 mg total) by mouth 3 (three) times daily as needed for up to 7 days. 03/19/23 03/26/23 Yes Teddy Sharper, FNP  latanoprost (XALATAN) 0.005 % ophthalmic solution Place 1 drop into both eyes at bedtime. 10/04/21  Yes [provider]  metFORMIN  (GLUCOPHAGE ) 500 MG tablet Take 1 tablet (500 mg total) by mouth 2 (two) times daily with a meal. 10/19/19 03/19/23 Yes Beese, Garnette LABOR, MD  predniSONE  (DELTASONE ) 20 MG tablet Take 3 tabs PO daily x 5 days. 03/19/23  Yes Teddy Sharper, FNP  aspirin 325 MG tablet Take 325 mg by mouth daily.    [provider]    Family History Family History  Problem Relation Age of Onset   COPD Mother    Diabetes Father    Diabetes Maternal Grandfather     Social History Social History   Tobacco Use   Smoking status: Never   Smokeless tobacco: Never  Vaping Use   Vaping status: Never Used  Substance Use Topics    Alcohol use: Not Currently    Alcohol/week: 0.0 standard drinks of alcohol    Comment: rare   Drug use: Never     Allergies   Elemental sulfur and Sulfa antibiotics   Review of Systems Review of Systems  HENT:  Positive for congestion, sinus pressure and sinus pain.   Respiratory:  Positive for cough.   All other systems reviewed and are negative.    Physical Exam Triage Vital Signs ED Triage Vitals  Encounter Vitals Group     BP      Systolic BP Percentile      Diastolic BP Percentile      Pulse      Resp      Temp      Temp src      SpO2      Weight      Height      Head Circumference      Peak Flow      Pain Score      Pain Loc      Pain Education      Exclude from Growth Chart    No data found.  Updated Vital Signs BP (!) 151/89 (BP Location: Right Arm)   Pulse (!) 59   Temp 98.3 F (36.8 C) (Oral)  Resp 18   Ht 5' 3 (1.6 m)   Wt 280 lb (127 kg)   SpO2 100%   BMI 49.60 kg/m    Physical Exam Vitals and nursing note reviewed.  Constitutional:      Appearance: Normal appearance. She is obese.  HENT:     Head: Normocephalic and atraumatic.     Right Ear: Tympanic membrane and external ear normal.     Left Ear: Tympanic membrane and external ear normal.     Ears:     Comments: Significant eustachian tube dysfunction noted bilaterally    Nose:     Comments: Turbinates are erythematous/edematous    Mouth/Throat:     Mouth: Mucous membranes are moist.     Pharynx: Oropharynx is clear.  Eyes:     Extraocular Movements: Extraocular movements intact.     Conjunctiva/sclera: Conjunctivae normal.     Pupils: Pupils are equal, round, and reactive to light.  Cardiovascular:     Rate and Rhythm: Normal rate and regular rhythm.     Pulses: Normal pulses.     Heart sounds: Normal heart sounds.  Pulmonary:     Effort: Pulmonary effort is normal.     Breath sounds: Normal breath sounds. No wheezing, rhonchi or rales.     Comments: Infrequent  nonproductive cough on exam Musculoskeletal:        General: Normal range of motion.     Cervical back: Normal range of motion and neck supple.  Skin:    General: Skin is warm and dry.  Neurological:     General: No focal deficit present.     Mental Status: She is alert and oriented to person, place, and time. Mental status is at baseline.  Psychiatric:        Mood and Affect: Mood normal.        Behavior: Behavior normal.      UC Treatments / Results  Labs (all labs ordered are listed, but only abnormal results are displayed) Labs Reviewed - No data to display  EKG   Radiology No results found.  Procedures Procedures (including critical Valentine time)  Medications Ordered in UC Medications - No data to display  Initial Impression / Assessment and Plan / UC Course  I have reviewed the triage vital signs and the nursing notes.  Pertinent labs & imaging results that were available during my Valentine of the patient were reviewed by me and considered in my medical decision making (see chart for details).     MDM: 1.  Acute maxillary sinusitis, recurrence not specified-Rx'd Augmentin  875/125 mg tablet: Take 1 tablet twice daily x 7 days; 2.  Chest congestion-Rx'd prednisone  20 mg tablet: Take 3 tabs p.o. daily x 5 days; 3.  Cough, unspecified type-Rx'd Tessalon  200 mg capsules: Take 1 capsule 3 times daily, as needed for cough. Advised patient to take medications as directed with food to completion.  Advised patient to take prednisone  with first dose of Augmentin  for the next 5 of 7 days.  Advised may take Tessalon  capsules daily or as needed for cough.  Encouraged to increase daily water intake to 64 ounces per day while taking these medications.  Advised if symptoms worsen and/or unresolved please follow-up with PCP or here for further evaluation.  Patient discharged home, hemodynamically stable. Final Clinical Impressions(s) / UC Diagnoses   Final diagnoses:  Acute maxillary  sinusitis, recurrence not specified  Chest congestion  Cough, unspecified type     Discharge Instructions  Advised patient to take medications as directed with food to completion.  Advised patient to take prednisone  with first dose of Augmentin  for the next 5 of 7 days.  Advised may take Tessalon  capsules daily or as needed for cough.  Encouraged to increase daily water intake to 64 ounces per day while taking these medications.  Advised if symptoms worsen and/or unresolved please follow-up with PCP or here for further evaluation.     ED Prescriptions     Medication Sig Dispense Auth. Provider   amoxicillin -clavulanate (AUGMENTIN ) 875-125 MG tablet Take 1 tablet by mouth every 12 (twelve) hours. 14 tablet Xylina Rhoads, FNP   predniSONE  (DELTASONE ) 20 MG tablet Take 3 tabs PO daily x 5 days. 15 tablet Robbert Langlinais, FNP   benzonatate  (TESSALON ) 200 MG capsule Take 1 capsule (200 mg total) by mouth 3 (three) times daily as needed for up to 7 days. 40 capsule Evvie Behrmann, FNP      PDMP not reviewed this encounter.   Teddy Sharper, FNP 03/19/23 [phone]

## 2023-03-19 NOTE — Discharge Instructions (Addendum)
 Advised patient to take medications as directed with food to completion.  Advised patient to take prednisone with first dose of Augmentin for the next 5 of 7 days.  Advised may take Tessalon capsules daily or as needed for cough.  Encouraged to increase daily water intake to 64 ounces per day while taking these medications.  Advised if symptoms worsen and/or unresolved please follow-up with PCP or here for further evaluation.

## 2024-01-23 ENCOUNTER — Ambulatory Visit
Admission: EM | Admit: 2024-01-23 | Discharge: 2024-01-23 | Disposition: A | Discharge status: Home / Self Care | Attending: Internal Medicine | Admitting: Internal Medicine

## 2024-01-23 DIAGNOSIS — J0100 Acute maxillary sinusitis, unspecified: Secondary | ICD-10-CM | POA: Diagnosis not present

## 2024-01-23 DIAGNOSIS — R051 Acute cough: Secondary | ICD-10-CM | POA: Diagnosis not present

## 2024-01-23 MED ORDER — AMOXICILLIN-POT CLAVULANATE 875-125 MG PO TABS: 1.0000 | ORAL_TABLET | Freq: Two times a day (BID) | ORAL | 0 refills | Status: AC

## 2024-01-23 NOTE — ED Triage Notes (Signed)
 Pt presents to UC for c/o head congestion, postnasal drip, cough worse at night, fever, wheezing for about 10 days. Pt states a person in her house has bronchitis. Aleve sinus D, afrin, mucinex, zyrtec and robitussin do not help.

## 2024-01-23 NOTE — Discharge Instructions (Signed)
 I have prescribed you an antibiotic to take for upper respiratory infection.  Please follow-up if any symptoms persist or worsen.

## 2024-01-23 NOTE — ED Provider Notes (Signed)
 Brittany Valentine    CSN: [id number] Arrival date & time: 01/23/24  1723      History   Chief Complaint No chief complaint on file.   HPI Brittany Valentine is a 58 y.o. female.   Patient presents with approximately 10-day history of nasal congestion, facial tenderness, postnasal drip, productive cough.  Reports Tmax of 101 at home twice.  She has a known sick contact in her household.  Patient has been taking multiple medications including Aleve sinus D, Afrin, Mucinex, Robitussin with no improvement in symptoms.  She denies formal diagnosis of asthma or COPD.     Past Medical History:  Diagnosis Date   Allergic rhinitis    Sleep apnea     Patient Active Problem List   Diagnosis Date Noted   OSA (obstructive sleep apnea) 02/23/2015    Past Surgical History:  Procedure Laterality Date   NO PAST SURGERIES      OB History   No obstetric history on file.      Home Medications    Prior to Admission medications   Medication Sig Start Date End Date Taking? Authorizing Provider  amoxicillin -clavulanate (AUGMENTIN ) 875-125 MG tablet Take 1 tablet by mouth every 12 (twelve) hours. 01/23/24  Yes Nephtali Docken, Darryle E, FNP  atorvastatin (LIPITOR) 10 MG tablet Take 10 mg by mouth daily. 04/04/22  Yes [provider]  latanoprost (XALATAN) 0.005 % ophthalmic solution Place 1 drop into both eyes at bedtime. 10/04/21  Yes [provider]  metFORMIN  (GLUCOPHAGE ) 500 MG tablet Take 1 tablet (500 mg total) by mouth 2 (two) times daily with a meal. 10/19/19 01/23/24 Yes Beese, Garnette LABOR, MD  aspirin 325 MG tablet Take 325 mg by mouth daily as needed for mild pain (pain score 1-3).    [provider]  predniSONE  (DELTASONE ) 20 MG tablet Take 3 tabs PO daily x 5 days. 03/19/23   Teddy Sharper, FNP    Family History Family History  Problem Relation Age of Onset   COPD Mother    Diabetes Father    Diabetes Maternal Grandfather     Social History Social  History   Tobacco Use   Smoking status: Never   Smokeless tobacco: Never  Vaping Use   Vaping status: Never Used  Substance Use Topics   Alcohol use: Not Currently    Alcohol/week: 0.0 standard drinks of alcohol    Comment: rare   Drug use: Never     Allergies   Elemental sulfur and Sulfa antibiotics   Review of Systems Review of Systems Per HPI  Physical Exam Triage Vital Signs ED Triage Vitals  Encounter Vitals Group     BP 01/23/24 1740 (!) 166/92     Girls Systolic BP Percentile --      Girls Diastolic BP Percentile --      Boys Systolic BP Percentile --      Boys Diastolic BP Percentile --      Pulse Rate 01/23/24 1740 65     Resp 01/23/24 1740 20     Temp 01/23/24 1740 98.1 F (36.7 C)     Temp Source 01/23/24 1740 Oral     SpO2 01/23/24 1740 96 %     Weight --      Height --      Head Circumference --      Peak Flow --      Pain Score 01/23/24 1732 0     Pain Loc --  Pain Education --      Exclude from Growth Chart --    No data found.  Updated Vital Signs BP (!) 166/92 (BP Location: Right Arm)   Pulse 65   Temp 98.1 F (36.7 C) (Oral)   Resp 20   SpO2 96%   Visual Acuity Right Eye Distance:   Left Eye Distance:   Bilateral Distance:    Right Eye Near:   Left Eye Near:    Bilateral Near:     Physical Exam Constitutional:      General: She is not in acute distress.    Appearance: Normal appearance. She is not toxic-appearing or diaphoretic.  HENT:     Head: Normocephalic and atraumatic.     Right Ear: Tympanic membrane and ear canal normal.     Left Ear: Tympanic membrane and ear canal normal.     Nose: Congestion present.     Mouth/Throat:     Mouth: Mucous membranes are moist.     Pharynx: Posterior oropharyngeal erythema present.  Eyes:     Extraocular Movements: Extraocular movements intact.     Conjunctiva/sclera: Conjunctivae normal.     Pupils: Pupils are equal, round, and reactive to light.  Cardiovascular:     Rate  and Rhythm: Normal rate and regular rhythm.     Pulses: Normal pulses.     Heart sounds: Normal heart sounds.  Pulmonary:     Effort: Pulmonary effort is normal. No respiratory distress.     Breath sounds: Normal breath sounds. No stridor. No wheezing, rhonchi or rales.  Musculoskeletal:        General: Normal range of motion.     Cervical back: Normal range of motion.  Skin:    General: Skin is warm and dry.  Neurological:     General: No focal deficit present.     Mental Status: She is alert and oriented to person, place, and time. Mental status is at baseline.  Psychiatric:        Mood and Affect: Mood normal.        Behavior: Behavior normal.      UC Treatments / Results  Labs (all labs ordered are listed, but only abnormal results are displayed) Labs Reviewed - No data to display  EKG   Radiology No results found.  Procedures Procedures (including critical Valentine time)  Medications Ordered in UC Medications - No data to display  Initial Impression / Assessment and Plan / UC Course  I have reviewed the triage vital signs and the nursing notes.  Pertinent labs & imaging results that were available during my Valentine of the patient were reviewed by me and considered in my medical decision making (see chart for details).     Suspect maxillary sinusitis.  Patient also has cough.  There are no adventitious lung sounds on exam so do not think that chest imaging is necessary.  Will treat with Augmentin  given concern of secondary bacterial infection and duration of symptoms.  Viral testing deferred given duration of symptoms as it would not change treatment.  Will defer prednisone  at this time given patient has a history of glaucoma.  Patient declined cough medication.  Advised strict follow-up precautions.  Patient verbalized understanding and was agreeable with plan. Final Clinical Impressions(s) / UC Diagnoses   Final diagnoses:  Acute non-recurrent maxillary sinusitis   Acute cough     Discharge Instructions      I have prescribed you an antibiotic to take for upper respiratory  infection.  Please follow-up if any symptoms persist or worsen.    ED Prescriptions     Medication Sig Dispense Auth. Provider   amoxicillin -clavulanate (AUGMENTIN ) 875-125 MG tablet Take 1 tablet by mouth every 12 (twelve) hours. 14 tablet [place], Meleane Selinger E, [place]      PDMP not reviewed this encounter.   Hazen Darryle BRAVO, [place] 01/23/24 [phone]
